# Patient Record
Sex: Female | Born: 1990 | Race: Black or African American | Hispanic: No | Marital: Single | Smoking: Never smoker
Health system: Southern US, Community
[De-identification: ages and names within clinical notes are randomized; demographics above are authoritative.]

## PROBLEM LIST (undated history)

## (undated) DIAGNOSIS — D649 Anemia, unspecified: Secondary | ICD-10-CM

## (undated) DIAGNOSIS — J189 Pneumonia, unspecified organism: Secondary | ICD-10-CM

## (undated) HISTORY — DX: Pneumonia, unspecified organism: J18.9

---

## 2015-07-02 ENCOUNTER — Encounter (HOSPITAL_BASED_OUTPATIENT_CLINIC_OR_DEPARTMENT_OTHER): Payer: Self-pay | Admitting: Emergency Medicine

## 2015-07-02 ENCOUNTER — Emergency Department (HOSPITAL_BASED_OUTPATIENT_CLINIC_OR_DEPARTMENT_OTHER)
Admission: EM | Admit: 2015-07-02 | Discharge: 2015-07-02 | Disposition: A | Discharge status: Home / Self Care | Payer: Self-pay | Attending: Emergency Medicine | Admitting: Emergency Medicine

## 2015-07-02 DIAGNOSIS — N938 Other specified abnormal uterine and vaginal bleeding: Secondary | ICD-10-CM | POA: Insufficient documentation

## 2015-07-02 DIAGNOSIS — Z862 Personal history of diseases of the blood and blood-forming organs and certain disorders involving the immune mechanism: Secondary | ICD-10-CM | POA: Insufficient documentation

## 2015-07-02 DIAGNOSIS — Z3202 Encounter for pregnancy test, result negative: Secondary | ICD-10-CM | POA: Insufficient documentation

## 2015-07-02 HISTORY — DX: Anemia, unspecified: D64.9

## 2015-07-02 LAB — CBC WITH DIFFERENTIAL/PLATELET
Basophils Absolute: 0 10*3/uL (ref 0.0–0.1)
Basophils Relative: 0 %
Eosinophils Absolute: 0.1 10*3/uL (ref 0.0–0.7)
Eosinophils Relative: 1 %
HEMATOCRIT: 37.5 % (ref 36.0–46.0)
HEMOGLOBIN: 12.2 g/dL (ref 12.0–15.0)
LYMPHS ABS: 4.2 10*3/uL — AB (ref 0.7–4.0)
LYMPHS PCT: 39 %
MCH: 26.2 pg (ref 26.0–34.0)
MCHC: 32.5 g/dL (ref 30.0–36.0)
MCV: 80.6 fL (ref 78.0–100.0)
Monocytes Absolute: 0.9 10*3/uL (ref 0.1–1.0)
Monocytes Relative: 8 %
NEUTROS PCT: 52 %
Neutro Abs: 5.6 10*3/uL (ref 1.7–7.7)
Platelets: 349 10*3/uL (ref 150–400)
RBC: 4.65 MIL/uL (ref 3.87–5.11)
RDW: 13.9 % (ref 11.5–15.5)
WBC: 10.8 10*3/uL — AB (ref 4.0–10.5)

## 2015-07-02 LAB — GC/CHLAMYDIA PROBE AMP (~~LOC~~) NOT AT ARMC
Chlamydia: NEGATIVE
Neisseria Gonorrhea: NEGATIVE

## 2015-07-02 LAB — PREGNANCY, URINE: Preg Test, Ur: NEGATIVE

## 2015-07-02 MED ORDER — MEDROXYPROGESTERONE ACETATE 10 MG PO TABS: 10.0000 mg | ORAL_TABLET | Freq: Every day | ORAL | Status: DC

## 2015-07-02 MED ORDER — ONDANSETRON 8 MG PO TBDP: 8.0000 mg | ORAL_TABLET | Freq: Three times a day (TID) | ORAL | Status: DC | PRN

## 2015-07-02 NOTE — ED Notes (Signed)
Extremely heavy bleeding with blood clots since yesterday. Pt states she has had her period for three months. Has used 36 tampons since yesterday morning.

## 2015-07-02 NOTE — ED Provider Notes (Signed)
CSN: [id number]     Arrival date & time 07/02/15  0133 History   First MD Initiated Contact with Patient 07/02/15 [phone]     Chief Complaint  Patient presents with  . Vaginal Bleeding     (Consider location/radiation/quality/duration/timing/severity/associated sxs/prior Treatment) HPI  This is a 24 year old female who normally has three-day periods. She is here with 3 months of persistent vaginal bleeding. The bleeding worsened over the past 2 days and she has been passing clots. She has used 36 tampons since yesterday morning. There is some associated sharp pain with this. She is having some lightheadedness as well.  Past Medical History  Diagnosis Date  . Anemia    History reviewed. No pertinent past surgical history. No family history on file. Social History  Substance Use Topics  . Smoking status: Never Smoker   . Smokeless tobacco: None  . Alcohol Use: No   OB History    Gravida Para Term Preterm AB TAB SAB Ectopic Multiple Living       Review of Systems  All other systems reviewed and are negative.   Allergies  Morphine and related  Home Medications   Prior to Admission medications   Not on File   BP 137/92 mmHg  Pulse 72  Resp 20  Ht  (1.651 m)  Wt 260 lb (117.935 kg)  BMI 43.27 kg/m2  SpO2 100%  LMP 07/02/2015   Physical Exam General: Well-developed, obese female in no acute distress; appearance consistent with age of record HENT: normocephalic; atraumatic Eyes: pupils equal, round and reactive to light; extraocular muscles intact; no conjunctival pallor Neck: supple Heart: regular rate and rhythm Lungs: clear to auscultation bilaterally Abdomen: soft; nondistended; suprapubic tenderness; bowel sounds present GU: Normal external genitalia; vaginal bleeding; no cervical motion tenderness Extremities: No deformity; full range of motion; pulses normal Neurologic: Awake, alert and oriented; motor function intact in all  extremities and symmetric; no facial droop Skin: Warm and dry Psychiatric: Normal mood and affect    ED Course  Procedures (including critical care time)   MDM  Nursing notes and vitals signs, including pulse oximetry, reviewed.  Summary of this visit's results, reviewed by myself:  Labs:  Results for orders placed or performed during the hospital encounter of 07/02/15 (from the past 24 hour(s))  CBC with Differential/Platelet     Status: Abnormal   Collection Time: 07/02/15  2:05 AM  Result Value Ref Range   WBC 10.8 (H) 4.0 - 10.5 K/uL   RBC 4.65 3.87 - 5.11 MIL/uL   Hemoglobin 12.2 12.0 - 15.0 g/dL   HCT 11.9 14.7 - 82.9 %   MCV 80.6 78.0 - 100.0 fL   MCH 26.2 26.0 - 34.0 pg   MCHC 32.5 30.0 - 36.0 g/dL   RDW 56.2 13.0 - 86.5 %   Platelets 349 150 - 400 K/uL   Neutrophils Relative % 52 %   Neutro Abs 5.6 1.7 - 7.7 K/uL   Lymphocytes Relative 39 %   Lymphs Abs 4.2 (H) 0.7 - 4.0 K/uL   Monocytes Relative 8 %   Monocytes Absolute 0.9 0.1 - 1.0 K/uL   Eosinophils Relative 1 %   Eosinophils Absolute 0.1 0.0 - 0.7 K/uL   Basophils Relative 0 %   Basophils Absolute 0.0 0.0 - 0.1 K/uL  Pregnancy, urine     Status: None   Collection Time: 07/02/15  2:29 AM  Result Value Ref Range  Preg Test, Ur NEGATIVE NEGATIVE       Paula LibraJohn Jaeleah Smyser, MD 07/02/15 [phone]

## 2015-07-02 NOTE — Discharge Instructions (Signed)

## 2015-07-17 ENCOUNTER — Encounter: Payer: Self-pay | Admitting: Family Medicine

## 2015-07-17 DIAGNOSIS — N938 Other specified abnormal uterine and vaginal bleeding: Secondary | ICD-10-CM

## 2015-07-23 ENCOUNTER — Telehealth: Payer: Self-pay | Admitting: *Deleted

## 2015-07-23 MED ORDER — MEDROXYPROGESTERONE ACETATE 10 MG PO TABS: 20.0000 mg | ORAL_TABLET | Freq: Every day | ORAL | Status: DC

## 2015-07-23 NOTE — Telephone Encounter (Addendum)
Message received from pharmacist stating that pt reported having spoken to our staff and Rx was supposed to be faxed however they have not received. [place] is currently in [place]. I called pt and she stated that she contacted us via MyChart and was told that her medication dose would be increased and rx would be sent to her pharmacy. Per chart review, pt had correspondence by Lyla Sonarrie, RN.   Rx e-prescribed today by Marylynn Pearsonarrie Hillman, RN

## 2015-07-25 ENCOUNTER — Encounter: Payer: Self-pay | Admitting: Family Medicine

## 2015-07-25 ENCOUNTER — Ambulatory Visit (INDEPENDENT_AMBULATORY_CARE_PROVIDER_SITE_OTHER): Payer: Self-pay | Admitting: Family Medicine

## 2015-07-25 VITALS — BP 118/83 | HR 79 | Temp 98.4°F | Resp 18 | Ht 65.0 in | Wt 280.8 lb

## 2015-07-25 DIAGNOSIS — N939 Abnormal uterine and vaginal bleeding, unspecified: Secondary | ICD-10-CM

## 2015-07-25 MED ORDER — NORGESTIMATE-ETH ESTRADIOL 0.25-35 MG-MCG PO TABS: 1.0000 | ORAL_TABLET | Freq: Every day | ORAL | Status: DC

## 2015-07-25 NOTE — Progress Notes (Signed)
   Subjective:    Patient ID: Brianna Wilson, female    DOB: 06/23/1991, 24 y.o.   MRN: [id number]  HPI Seen for vaginal bleeding.  Started in September and continual heavy bleeding since that time.  Was seen in ED in early December and given provera for 10 days.  Bleeding returned again 2-3 days after stopping the medication.  Started provera again on 12/22 and stopped.  Prior to this, menses every 30 days with bleeding for 3 days.  Started menses at age 24.   Review of Systems  Constitutional: Negative for fever, chills and fatigue.  Respiratory: Negative for shortness of breath.   Gastrointestinal: Negative for nausea, vomiting, abdominal pain and diarrhea.  Genitourinary: Negative for hematuria, vaginal discharge, vaginal pain and dyspareunia.  Neurological: Negative for dizziness, light-headedness and headaches.  All other systems reviewed and are negative.  I have reviewed the patients past medical, family, and social history.  I have reviewed the patient's medication list and allergies.     Objective:   Physical Exam  Constitutional: She is oriented to person, place, and time. She appears well-developed and well-nourished.  HENT:  Head: Normocephalic and atraumatic.  Right Ear: External ear normal.  Left Ear: External ear normal.  Eyes: Conjunctivae and EOM are normal. Pupils are equal, round, and reactive to light. No scleral icterus.  Neck: Normal range of motion. Neck supple.  Cardiovascular: Normal rate, regular rhythm and normal heart sounds.  Exam reveals no gallop and no friction rub.   No murmur heard. Pulmonary/Chest: Effort normal and breath sounds normal. No respiratory distress. She has no wheezes. She has no rales. She exhibits no tenderness.  Abdominal: Soft. Bowel sounds are normal. She exhibits no distension and no mass. There is no tenderness. There is no rebound and no guarding.  Musculoskeletal: She exhibits no edema or tenderness.  Neurological: She is alert  and oriented to person, place, and time.  Skin: Skin is warm and dry. No rash noted. No erythema. No pallor.  Psychiatric: She has a normal mood and affect. Her behavior is normal. Judgment and thought content normal.      Assessment & Plan:  1. Abnormal uterine bleeding Will stop provera and switch to sprintec, as this will be more manageable.  Will follow up in 4-6 months.  Discussed that will likely have withdrawal bleed when transitions from one birth control. Advised patient to call if she continues to have uncontrolled bleeding despite birth control pills.

## 2015-07-29 ENCOUNTER — Other Ambulatory Visit: Payer: Self-pay

## 2015-07-29 MED ORDER — IBUPROFEN 600 MG PO TABS: 600.0000 mg | ORAL_TABLET | Freq: Four times a day (QID) | ORAL | Status: DC | PRN

## 2015-08-26 ENCOUNTER — Other Ambulatory Visit: Payer: Self-pay | Admitting: Family Medicine

## 2015-08-26 ENCOUNTER — Telehealth: Payer: Self-pay | Admitting: *Deleted

## 2015-08-26 NOTE — Telephone Encounter (Signed)
Pt left message stating that she saw Dr. Adrian Blackwater last month and started on OCP for bleeding. She was told to call back if she was having problems. She reports that after starting on the OCP. The first 2 weeks were fine then she started to bleed and has not stopped. She wants to stop the OCP's and have Dr. Adrian Blackwater to put her back on Provera as this stopped her bleeding previously. I returned pt's call and discussed her concerns. She confirms that she has not skipped or missed any pills. She had no bleeding for the first 2 weeks of her initial pill pack. She states that she has been bleeding for 2 weeks now and at times is very heavy, bright red and with clots. She is due to begin the second pack of pills in 2 days and is not sure what to do. I advised pt that I will send a message to Dr. Adrian Blackwater and call her back with his response.

## 2015-08-28 ENCOUNTER — Encounter: Payer: Self-pay | Admitting: Family Medicine

## 2015-08-28 ENCOUNTER — Telehealth: Payer: Self-pay | Admitting: General Practice

## 2015-08-28 NOTE — Telephone Encounter (Signed)
I can recommend one of two options: she could continue taking the OCPs, starting the second pack.  Occasionally, it takes a couple of cycles before the bleeding become controlled.  The second option would be to restart the provera, then switch to depo provera when bleeding is controlled.

## 2015-08-28 NOTE — Telephone Encounter (Signed)
Patient called and left message stating she is wanting to talk to a nurse. Patient states she is waiting for a return call in reference to her medication.

## 2015-08-28 NOTE — Telephone Encounter (Signed)
Returned patient's call. She was upset because she was expecting a call yesterday. She stated she has been bleeding since the second week of her first pack of birth control pills. She never started the second pack. I told her the options per Dr Adrian Blackwater put forth in his response to the prior encounter note. She was not agreeable to any of those options. She stated that the problem is that she wants to have babies and all the options will not let her do that. I suggested that she make an appointment in the clinic to talk to a provider. Patient does not have insurance and can't afford to pay. Will have front office send her a charity application and schedule her to be seen. She requests only Dr Adrian Blackwater.

## 2015-09-26 ENCOUNTER — Ambulatory Visit: Payer: Self-pay | Admitting: Family Medicine

## 2015-11-01 ENCOUNTER — Encounter: Payer: Self-pay | Admitting: Family Medicine

## 2015-11-01 ENCOUNTER — Ambulatory Visit (INDEPENDENT_AMBULATORY_CARE_PROVIDER_SITE_OTHER): Payer: Medicaid Other | Admitting: Family Medicine

## 2015-11-01 VITALS — BP 130/74 | HR 79 | Temp 98.6°F | Ht 65.0 in | Wt 283.5 lb

## 2015-11-01 DIAGNOSIS — N939 Abnormal uterine and vaginal bleeding, unspecified: Secondary | ICD-10-CM

## 2015-11-01 DIAGNOSIS — Z3041 Encounter for surveillance of contraceptive pills: Secondary | ICD-10-CM

## 2015-11-01 LAB — POCT PREGNANCY, URINE: Preg Test, Ur: NEGATIVE

## 2015-11-01 MED ORDER — NORGESTIMATE-ETH ESTRADIOL 0.25-35 MG-MCG PO TABS: 1.0000 | ORAL_TABLET | Freq: Every day | ORAL | Status: DC

## 2015-11-01 NOTE — Progress Notes (Signed)
States  Here because you have not had a period since she stopped the birth control pills. Had pills for just a month or so because of heavy bleeding. Before all that had regular periods.

## 2015-11-01 NOTE — Progress Notes (Signed)
   Subjective:    Patient ID: Brianna Wilson, female    DOB: Apr 28, 1991, 25 y.o.   MRN: [id number]  HPI Patient seen for follow up of AUB.  Was placed on sprintec in December, which was working well.  Had confusion at lab and was not able to pick up refill.  Last pills in February, has not had bleeding since.     Review of Systems  Constitutional: Negative for fever, chills and fatigue.  Genitourinary: Negative for vaginal bleeding, vaginal discharge and vaginal pain.       Objective:   Physical Exam  Constitutional: She appears well-developed and well-nourished.  Cardiovascular: Normal rate, regular rhythm and normal heart sounds.  Exam reveals no gallop and no friction rub.   No murmur heard. Pulmonary/Chest: Effort normal and breath sounds normal.       Assessment & Plan:  1. Abnormal uterine bleeding Continue sprintec.  Advised to continue until she wants to become pregnant.  Also discussed that may take 5-6 months before cycle returns after stopping pills.  F/u 1 year for annual.

## 2015-12-06 ENCOUNTER — Encounter: Payer: Self-pay | Admitting: Family Medicine

## 2015-12-09 ENCOUNTER — Other Ambulatory Visit: Payer: Self-pay | Admitting: Family Medicine

## 2015-12-09 MED ORDER — MEDROXYPROGESTERONE ACETATE 10 MG PO TABS: 10.0000 mg | ORAL_TABLET | Freq: Every day | ORAL | Status: DC

## 2015-12-09 NOTE — Telephone Encounter (Signed)
Called patient.  Would like to be on provera.  Will start on provera 10mg  PO, then change to depo provera.  Will schedule for PAP per patient's request.

## 2016-04-06 ENCOUNTER — Other Ambulatory Visit: Payer: Self-pay | Admitting: Family Medicine

## 2016-05-17 ENCOUNTER — Other Ambulatory Visit: Payer: Self-pay | Admitting: Family Medicine

## 2016-05-20 ENCOUNTER — Ambulatory Visit: Payer: Medicaid Other | Admitting: Family Medicine

## 2016-05-29 ENCOUNTER — Encounter: Payer: Self-pay | Admitting: Family Medicine

## 2016-05-29 ENCOUNTER — Other Ambulatory Visit: Payer: Self-pay | Admitting: Family Medicine

## 2016-05-29 DIAGNOSIS — N939 Abnormal uterine and vaginal bleeding, unspecified: Secondary | ICD-10-CM

## 2016-06-02 MED ORDER — MEDROXYPROGESTERONE ACETATE 10 MG PO TABS: 10.0000 mg | ORAL_TABLET | Freq: Every day | ORAL | 1 refills | Status: DC

## 2016-07-31 ENCOUNTER — Encounter: Payer: Self-pay | Admitting: Family Medicine

## 2016-07-31 ENCOUNTER — Ambulatory Visit (INDEPENDENT_AMBULATORY_CARE_PROVIDER_SITE_OTHER): Payer: BLUE CROSS/BLUE SHIELD | Admitting: Family Medicine

## 2016-07-31 VITALS — BP 126/74 | HR 68 | Wt 285.2 lb

## 2016-07-31 DIAGNOSIS — Z0000 Encounter for general adult medical examination without abnormal findings: Secondary | ICD-10-CM

## 2016-07-31 DIAGNOSIS — Z3042 Encounter for surveillance of injectable contraceptive: Secondary | ICD-10-CM | POA: Diagnosis not present

## 2016-07-31 DIAGNOSIS — Z01419 Encounter for gynecological examination (general) (routine) without abnormal findings: Secondary | ICD-10-CM | POA: Diagnosis not present

## 2016-07-31 MED ORDER — MEDROXYPROGESTERONE ACETATE 150 MG/ML IM SUSP
150.0000 mg | Freq: Once | INTRAMUSCULAR | Status: AC
  Administered 2016-07-31: 150 mg via INTRAMUSCULAR

## 2016-07-31 NOTE — Progress Notes (Signed)
Subjective:     Brianna Wilson is a 26 y.o. female and is here for a comprehensive physical exam. The patient reports no problems.  Heterosexual relationship, declines STD testing. Light periods every month. Last PAP - normal    Social History   Social History  . Marital status: Single    Spouse name: N/A  . Number of children: N/A  . Years of education: N/A   Occupational History  . Not on file.   Social History Main Topics  . Smoking status: Never Smoker  . Smokeless tobacco: Never Used  . Alcohol use No  . Drug use: No  . Sexual activity: Yes    Birth control/ protection: None   Other Topics Concern  . Not on file   Social History Narrative  . No narrative on file   Health Maintenance  Topic Date Due  . HIV Screening  04/15/2004  . TETANUS/TDAP  04/15/2008  . PAP SMEAR  04/15/2010  . INFLUENZA VACCINE  02/25/2016    The following portions of the patient's history were reviewed and updated as appropriate: allergies, current medications, past family history, past medical history, past social history, past surgical history and problem list.  Review of Systems Pertinent items are noted in HPI.   Objective:   General Appearance:    Alert, cooperative, no distress, appears stated age  Head:    Normocephalic, without obvious abnormality, atraumatic  Eyes:    PERRL, conjunctiva/corneas clear, EOM's intact, fundi    benign, both eyes  Neck:   Supple, symmetrical, trachea midline, no adenopathy;    thyroid:  no enlargement/tenderness/nodules; no carotid   bruit or JVD  Back:     Symmetric, no curvature, ROM normal, no CVA tenderness  Lungs:     Clear to auscultation bilaterally, respirations unlabored  Chest Wall:    No tenderness or deformity   Heart:    Regular rate and rhythm, S1 and S2 normal, no murmur, rub   or gallop  Breast Exam:    No tenderness, masses, or nipple abnormality  Abdomen:     Soft, non-tender, bowel sounds active all four quadrants,    no  masses, no organomegaly  Genitalia:    Normal female without lesion, discharge or tenderness.         Vaginal rugae normal.  Cervix normal in appearance.  Uterus   normal in size.  Adnexa normal, no masses or fullness   palpated.  Extremities:   Extremities normal, atraumatic, no cyanosis or edema  Pulses:   2+ and symmetric all extremities  Skin:   Skin color, texture, turgor normal, no rashes or lesions  Lymph nodes:   Cervical, supraclavicular, and axillary nodes normal  Neurologic:   CNII-XII intact, normal strength, sensation and reflexes    throughout      Assessment:    Healthy female exam.      Plan:    Depo provera PAP done. Discussed healthy diet and exercise. See After Visit Summary for Counseling Recommendations

## 2016-07-31 NOTE — Addendum Note (Signed)
Addended by: Faythe CasaBELLAMY, Kaydince Towles M on: 07/31/2016 11:52 AM   Modules accepted: Orders

## 2016-08-03 LAB — CYTOLOGY - PAP: Diagnosis: NEGATIVE

## 2016-10-29 ENCOUNTER — Ambulatory Visit (INDEPENDENT_AMBULATORY_CARE_PROVIDER_SITE_OTHER): Payer: BLUE CROSS/BLUE SHIELD | Admitting: Family Medicine

## 2016-10-29 VITALS — BP 121/77 | HR 81 | Ht 65.0 in | Wt 286.3 lb

## 2016-10-29 DIAGNOSIS — Z3042 Encounter for surveillance of injectable contraceptive: Secondary | ICD-10-CM

## 2016-10-29 MED ORDER — MEDROXYPROGESTERONE ACETATE 150 MG/ML IM SUSP
150.0000 mg | Freq: Once | INTRAMUSCULAR | Status: AC
  Administered 2016-10-29: 150 mg via INTRAMUSCULAR

## 2016-10-29 NOTE — Progress Notes (Signed)
   Subjective:    Patient ID: Brianna Wilson, female    DOB: [birth year], 26 y.o.   MRN: [id number]  HPI Seen for follow up of contraception. Concerned about weight gain, but she has only gained 1 pound from last visit 3 months ago. No problems with injection. Had 3 days of bleeding in March, but nothing else.   Review of Systems     Objective:   Physical Exam  Constitutional: She appears well-developed and well-nourished.  Cardiovascular: Normal rate and regular rhythm.   Pulmonary/Chest: Effort normal and breath sounds normal.  Abdominal: Soft. There is no tenderness.  Skin: Skin is warm and dry. No rash noted. No erythema. No pallor.  Psychiatric: She has a normal mood and affect. Her behavior is normal. Judgment and thought content normal.      Assessment & Plan:  1. Encounter for surveillance of injectable contraceptive Continue injections

## 2016-12-14 ENCOUNTER — Other Ambulatory Visit: Payer: Self-pay | Admitting: Family Medicine

## 2016-12-14 ENCOUNTER — Other Ambulatory Visit: Payer: Self-pay | Admitting: General Practice

## 2016-12-14 DIAGNOSIS — N946 Dysmenorrhea, unspecified: Secondary | ICD-10-CM

## 2016-12-14 MED ORDER — IBUPROFEN 600 MG PO TABS: 600.0000 mg | ORAL_TABLET | Freq: Three times a day (TID) | ORAL | 1 refills | Status: DC | PRN

## 2016-12-27 ENCOUNTER — Encounter: Payer: Self-pay | Admitting: Family Medicine

## 2016-12-28 ENCOUNTER — Encounter: Payer: Self-pay | Admitting: Family Medicine

## 2016-12-31 ENCOUNTER — Ambulatory Visit: Payer: BLUE CROSS/BLUE SHIELD | Admitting: Family Medicine

## 2017-01-04 ENCOUNTER — Encounter: Payer: Self-pay | Admitting: Family Medicine

## 2017-01-04 ENCOUNTER — Ambulatory Visit (INDEPENDENT_AMBULATORY_CARE_PROVIDER_SITE_OTHER): Payer: BLUE CROSS/BLUE SHIELD | Admitting: Family Medicine

## 2017-01-04 DIAGNOSIS — N920 Excessive and frequent menstruation with regular cycle: Secondary | ICD-10-CM

## 2017-01-04 NOTE — Patient Instructions (Signed)

## 2017-01-04 NOTE — Assessment & Plan Note (Signed)
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

## 2017-01-04 NOTE — Progress Notes (Signed)
   Subjective:    Patient ID: Brianna Wilson is a 26 y.o. female presenting with Pelvic Pain and Menstrual Problem (bleeding since April )  on 01/04/2017  HPI: Seen with abnormal bleeding and placed on provera, sprintec then Depo. Has had 2 depo shots and now having some bleeding with spotting from 3 days after a shot until just now.. Having pain. Does not want to be on contraception.  Review of Systems  Constitutional: Negative for chills and fever.  Respiratory: Negative for shortness of breath.   Cardiovascular: Negative for chest pain.  Gastrointestinal: Negative for abdominal pain, nausea and vomiting.  Genitourinary: Negative for dysuria.  Skin: Negative for rash.      Objective:    BP 118/71   Pulse 76   Ht 5\' 5"  (1.651 m)   Wt 283 lb (128.4 kg)   LMP 10/31/2016 (Exact Date)   BMI 47.09 kg/m  Physical Exam  Constitutional: She is oriented to person, place, and time. She appears well-developed and well-nourished. No distress.  HENT:  Head: Normocephalic and atraumatic.  Eyes: No scleral icterus.  Neck: Neck supple.  Cardiovascular: Normal rate.   Pulmonary/Chest: Effort normal.  Abdominal: Soft.  Neurological: She is alert and oriented to person, place, and time.  Skin: Skin is warm and dry.  Psychiatric: She has a normal mood and affect.        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      Relevant Orders   TSH   US Transvaginal Non-OB      Total face-to-face time with patient: 15 minutes. Over 50% of encounter was spent on counseling and coordination of care. Return in about 3 months (around 04/06/2017).  Reva Boresanya S Diezel Mazur 01/04/2017 11:23  AM

## 2017-01-05 LAB — TSH: TSH: 1.24 u[IU]/mL (ref 0.450–4.500)

## 2017-01-08 ENCOUNTER — Ambulatory Visit (HOSPITAL_COMMUNITY)
Admission: RE | Admit: 2017-01-08 | Discharge: 2017-01-08 | Disposition: A | Discharge status: Home / Self Care | Payer: BLUE CROSS/BLUE SHIELD | Source: Ambulatory Visit | Attending: Family Medicine | Admitting: Family Medicine

## 2017-01-08 DIAGNOSIS — N920 Excessive and frequent menstruation with regular cycle: Secondary | ICD-10-CM | POA: Diagnosis present

## 2018-07-05 ENCOUNTER — Encounter: Payer: Self-pay | Admitting: *Deleted

## 2018-07-05 ENCOUNTER — Other Ambulatory Visit: Payer: Self-pay | Admitting: *Deleted

## 2018-07-05 DIAGNOSIS — N939 Abnormal uterine and vaginal bleeding, unspecified: Secondary | ICD-10-CM

## 2018-07-05 MED ORDER — MEDROXYPROGESTERONE ACETATE 10 MG PO TABS
10.0000 mg | ORAL_TABLET | Freq: Every day | ORAL | 0 refills | Status: DC
Start: 2018-07-05 — End: 2018-08-08

## 2018-07-05 NOTE — Progress Notes (Signed)
Opened in error

## 2018-07-05 NOTE — Progress Notes (Signed)
Patient requested one refill of provera to last until her appointment with us. Approved by Dr. Shawnie PonsPratt.

## 2018-07-25 ENCOUNTER — Ambulatory Visit: Payer: BLUE CROSS/BLUE SHIELD | Admitting: Family Medicine

## 2018-08-08 ENCOUNTER — Ambulatory Visit (INDEPENDENT_AMBULATORY_CARE_PROVIDER_SITE_OTHER): Payer: Self-pay | Admitting: Family Medicine

## 2018-08-08 ENCOUNTER — Encounter: Payer: Self-pay | Admitting: Family Medicine

## 2018-08-08 VITALS — BP 132/82 | HR 89 | Wt 301.9 lb

## 2018-08-08 DIAGNOSIS — N939 Abnormal uterine and vaginal bleeding, unspecified: Secondary | ICD-10-CM

## 2018-08-08 NOTE — Progress Notes (Signed)
   Subjective:    Patient ID: Brianna Wilson, female    DOB: Dec 03, 1990, 28 y.o.   MRN: [id number]  HPI Patient seen for AUB x30 days. This occurred after taking a weight loss cleanse. Had lost about 7 pounds in a couple of days. Has continued to diet. Trying to lose weight. Was started on provera 10mg  daily. Bleeding slowed, but didn't stop until 3 days ago. Is now out of 30 day supply of provera.   Review of Systems     Objective:   Physical Exam Constitutional:      Appearance: She is obese.  HENT:     Head: Normocephalic and atraumatic.  Cardiovascular:     Rate and Rhythm: Normal rate and regular rhythm.     Pulses: Normal pulses.     Heart sounds: Normal heart sounds.  Pulmonary:     Effort: Pulmonary effort is normal.     Breath sounds: Normal breath sounds.  Abdominal:     General: Abdomen is flat.     Palpations: Abdomen is soft.  Skin:    Capillary Refill: Capillary refill takes less than 2 seconds.  Neurological:     General: No focal deficit present.     Mental Status: She is alert.       Assessment & Plan:  1. Abnormal uterine bleeding (AUB) Likely due to weight loss/dieting. Will watch for now. Discussed contraception: IUD vs nexplanon. IUD would be preferrable - more weight neutral and better bleeding side effect profile.

## 2018-08-11 ENCOUNTER — Other Ambulatory Visit: Payer: Self-pay | Admitting: Family Medicine

## 2018-08-11 MED ORDER — MEDROXYPROGESTERONE ACETATE 10 MG PO TABS: 20.0000 mg | ORAL_TABLET | Freq: Every day | ORAL | 3 refills | Status: DC

## 2019-01-25 IMAGING — US US TRANSVAGINAL NON-OB
1 series · 15 of 25 positions shown · non-contrast
Comparison: None.

CLINICAL DATA: Menorrhagia with regular cycle. Gravida 1 para 0
AB1. Constant bleeding.

EXAM:
ULTRASOUND PELVIS TRANSVAGINAL
TECHNIQUE: Transvaginal ultrasound examination of the pelvis was performed
including evaluation of the uterus, ovaries, adnexal regions, and
pelvic cul-de-sac.

[Series 1: us transvaginal non-ob · 15 of 35 slices shown]
[im 1/35]
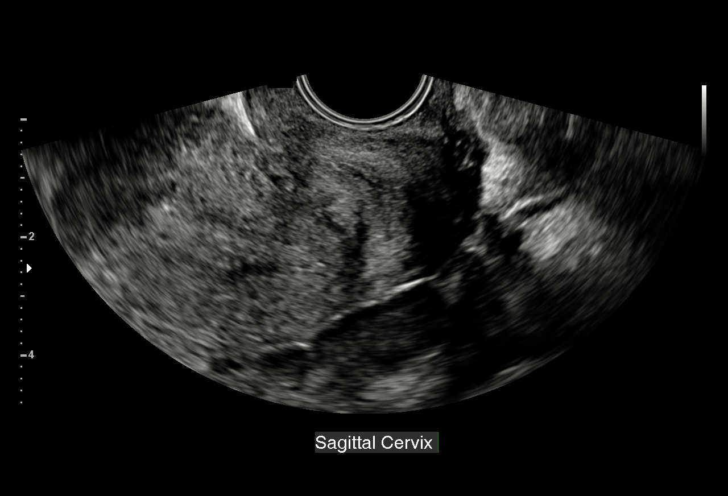
[im 3/35]
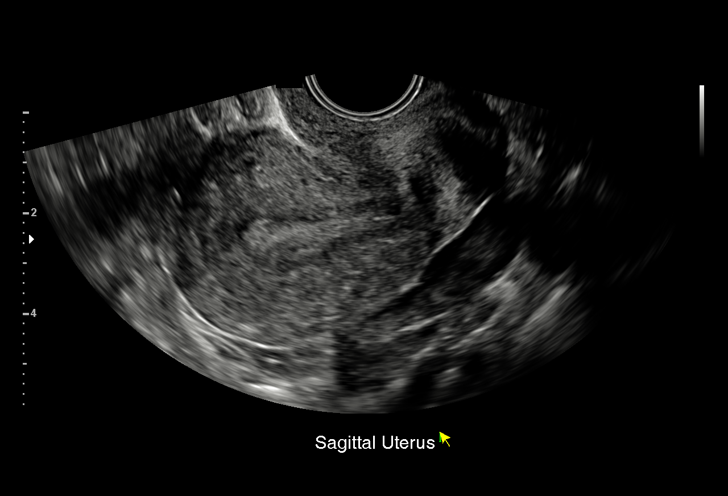
[im 6/35]
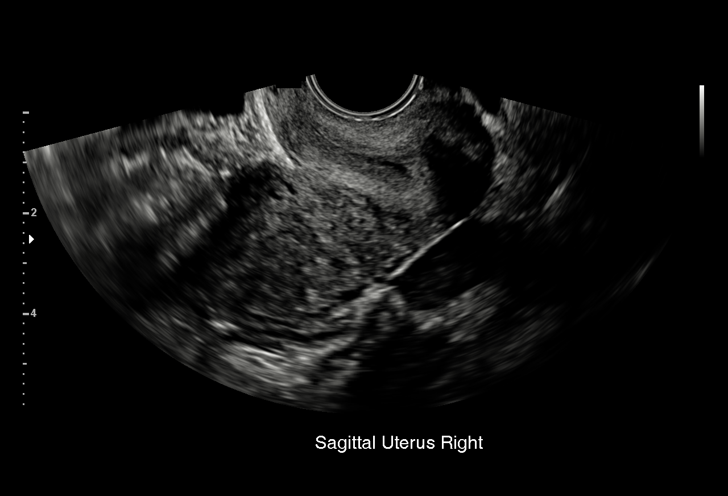
[im 8/35]
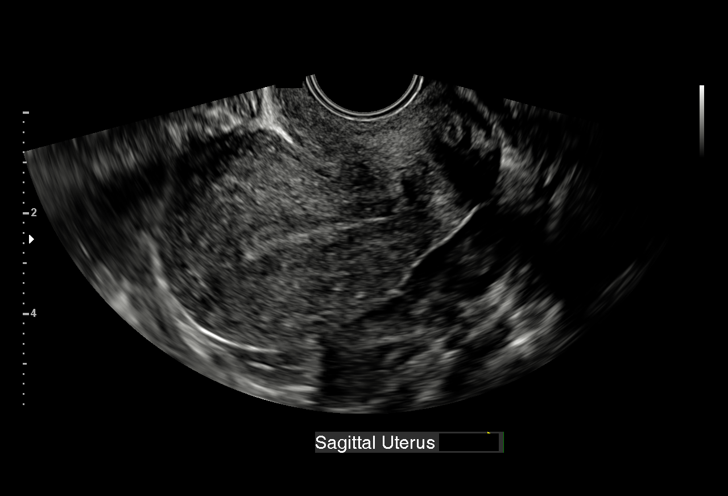
[im 10/35]
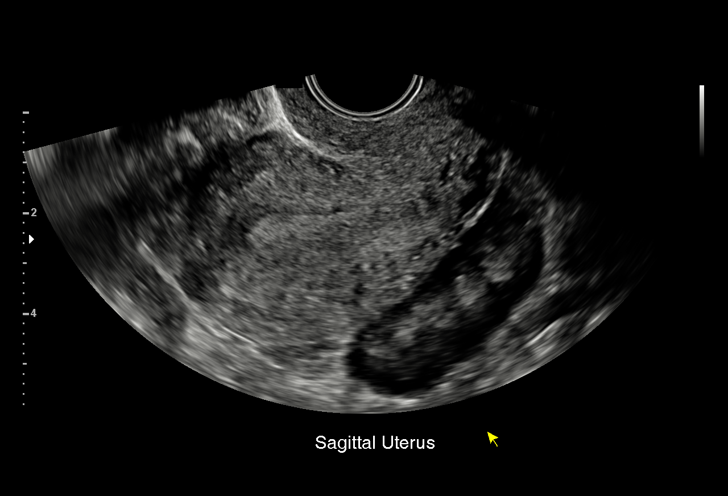
[im 13/35]
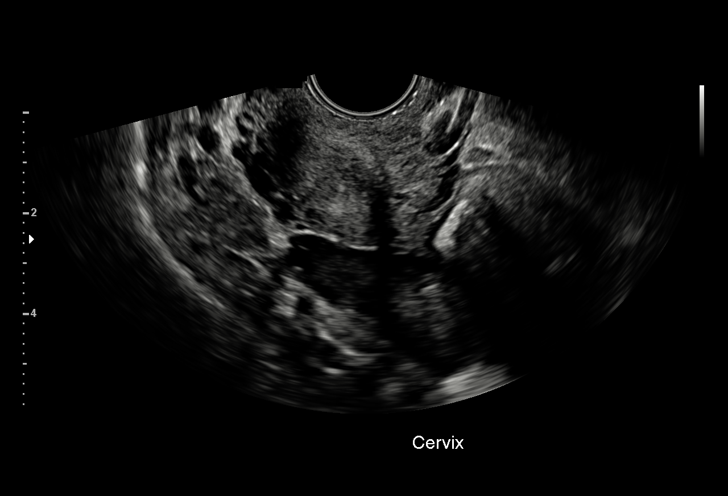
[im 15/35]
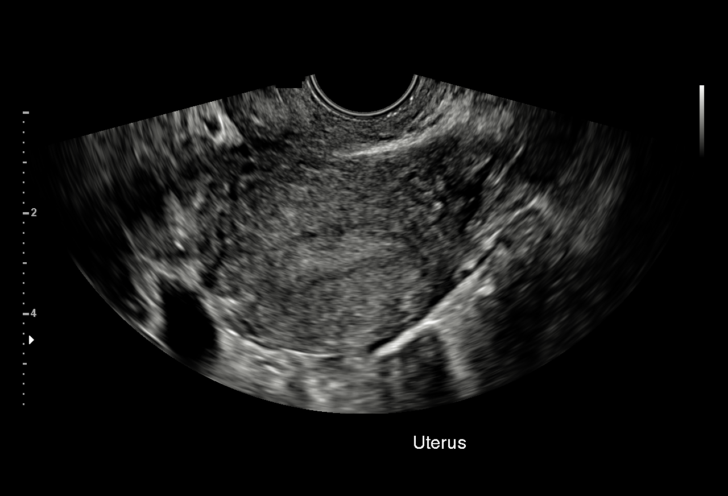
[im 18/35]
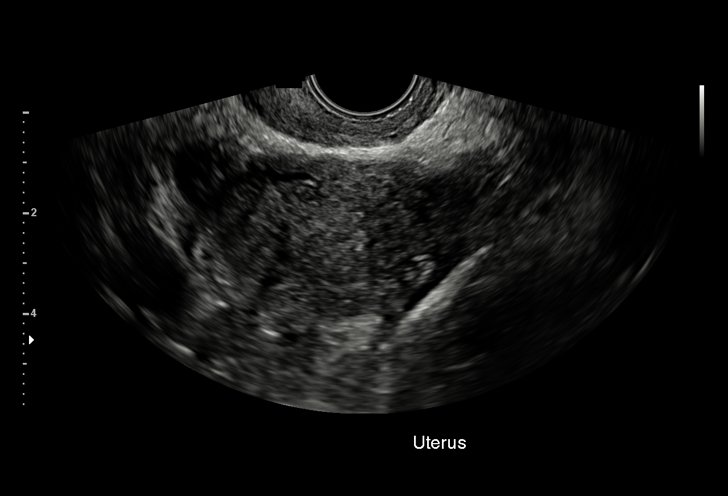
[im 20/35]
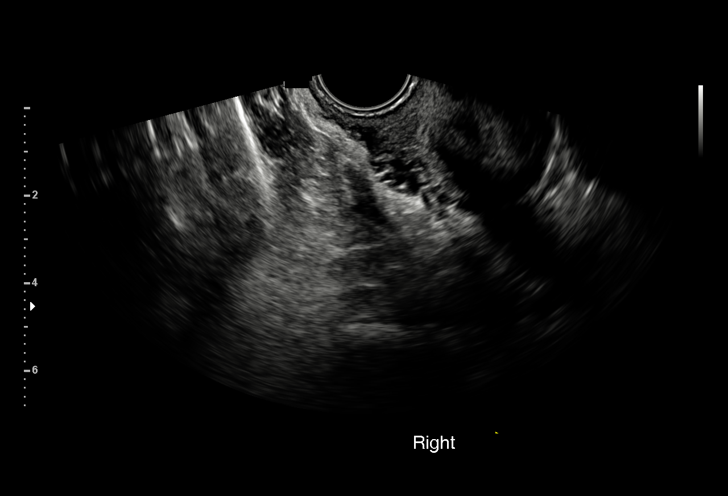
[im 22/35]
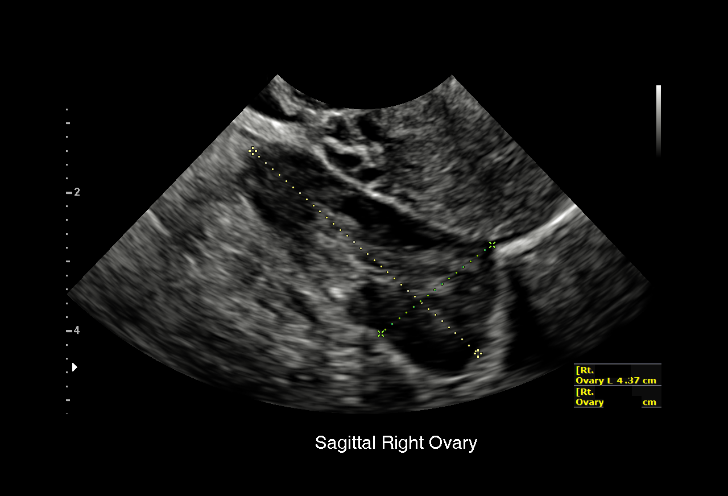
[im 25/35]
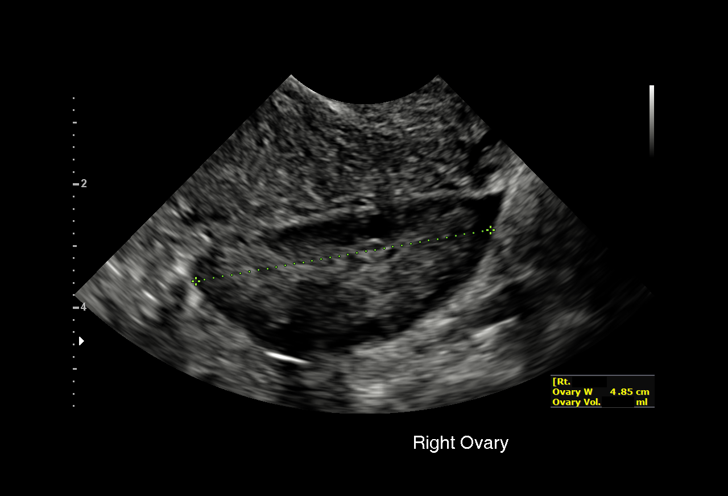
[im 27/35]
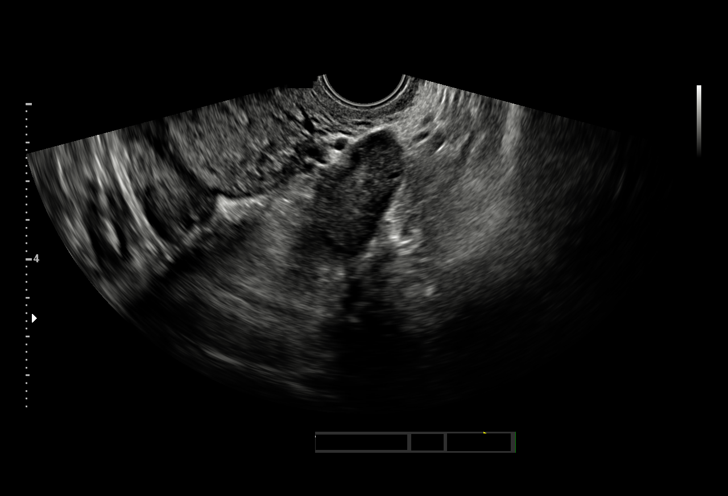
[im 29/35]
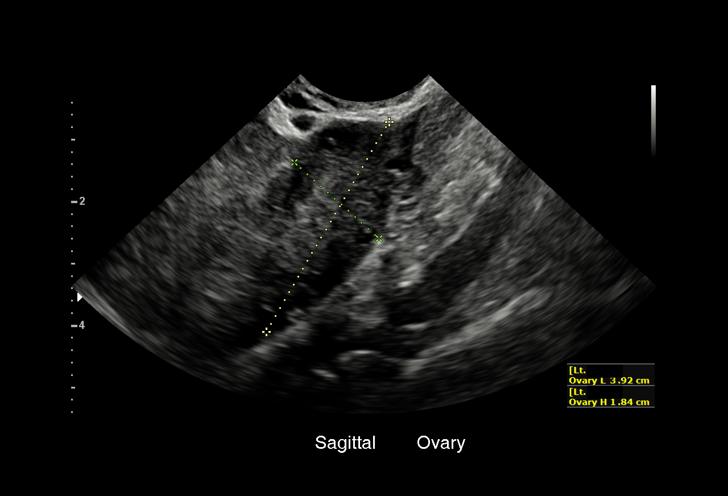
[im 32/35]
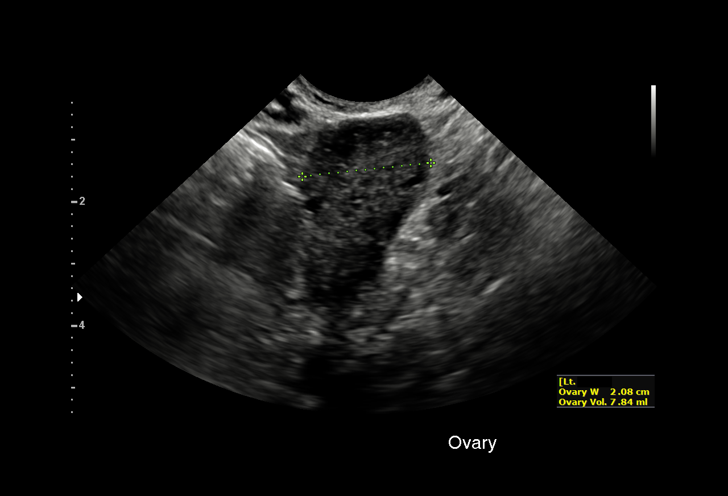
[im 35/35]
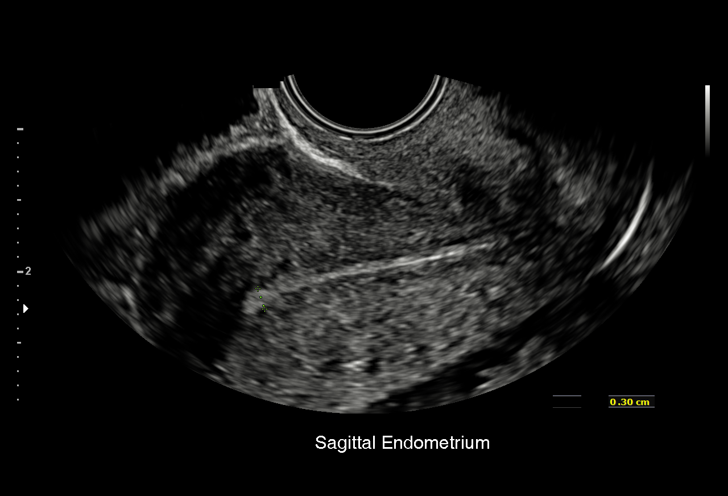

[15 of 25 positions shown; findings below may reference images not displayed]

FINDINGS: Uterus

Measurements: 7.2 x 4.3 x 5.5 cm. No fibroids or other mass
visualized.

Endometrium

Thickness: 3.0 mm.  No focal abnormality visualized.

Right ovary

Measurements: 4.4 x 2.1 x 4.9 cm. Normal appearance/no adnexal mass.

Left ovary

Measurements: 3.9 x 1.8 x 2.1 cm. Normal appearance/no adnexal mass.

Other findings: Small amount of free pelvic fluid is likely
physiologic.
IMPRESSION: Normal pelvic ultrasound.

## 2019-06-13 MED ORDER — MEDROXYPROGESTERONE ACETATE 10 MG PO TABS: 20.0000 mg | ORAL_TABLET | Freq: Every day | ORAL | 3 refills | Status: AC

## 2020-01-24 ENCOUNTER — Other Ambulatory Visit (HOSPITAL_COMMUNITY)
Admission: RE | Admit: 2020-01-24 | Discharge: 2020-01-24 | Disposition: A | Discharge status: Home / Self Care | Payer: BC Managed Care – PPO | Source: Ambulatory Visit | Attending: Family Medicine | Admitting: Family Medicine

## 2020-01-24 ENCOUNTER — Ambulatory Visit (INDEPENDENT_AMBULATORY_CARE_PROVIDER_SITE_OTHER): Payer: BC Managed Care – PPO | Admitting: Family Medicine

## 2020-01-24 ENCOUNTER — Encounter: Payer: Self-pay | Admitting: Family Medicine

## 2020-01-24 ENCOUNTER — Other Ambulatory Visit: Payer: Self-pay

## 2020-01-24 VITALS — BP 135/91 | HR 85 | Ht 65.0 in | Wt 307.0 lb

## 2020-01-24 DIAGNOSIS — Z30430 Encounter for insertion of intrauterine contraceptive device: Secondary | ICD-10-CM

## 2020-01-24 DIAGNOSIS — N939 Abnormal uterine and vaginal bleeding, unspecified: Secondary | ICD-10-CM | POA: Insufficient documentation

## 2020-01-24 DIAGNOSIS — Z124 Encounter for screening for malignant neoplasm of cervix: Secondary | ICD-10-CM

## 2020-01-24 MED ORDER — LEVONORGESTREL 19.5 MCG/DAY IU IUD: INTRAUTERINE_SYSTEM | Freq: Once | INTRAUTERINE | Status: AC

## 2020-01-24 NOTE — Progress Notes (Addendum)
Having AUB. Fairly consistent if not taking provera. Will place IUD. Also due for PAP.  IUD Procedure Note Patient identified, informed consent performed, signed copy in chart, time out was performed.  Urine pregnancy test negative.  Speculum placed in the vagina.  Cervix visualized.  Cleaned with Betadine x 2.  Cervical block placed with lidocaine with epi, 7mL. Grasped anteriorly with a single tooth tenaculum.  Uterus sounded to 9 cm.  Liletta  IUD placed per manufacturer's recommendations.  Strings trimmed to 3 cm. Tenaculum was removed, good hemostasis noted.  Patient tolerated procedure well.   Patient given post procedure instructions and Liletta care card with expiration date.  Patient is asked to check IUD strings periodically and follow up in 4-6 weeks for IUD check.

## 2020-01-24 NOTE — Progress Notes (Signed)
cyt

## 2020-01-25 LAB — CYTOLOGY - PAP
Chlamydia: NEGATIVE
Comment: NEGATIVE
Comment: NEGATIVE
Comment: NORMAL
Diagnosis: NEGATIVE
High risk HPV: POSITIVE — AB
Neisseria Gonorrhea: NEGATIVE

## 2020-01-25 LAB — POCT PREGNANCY, URINE: Preg Test, Ur: NEGATIVE

## 2020-01-26 ENCOUNTER — Encounter: Payer: Self-pay | Admitting: Family Medicine

## 2020-01-26 DIAGNOSIS — B977 Papillomavirus as the cause of diseases classified elsewhere: Secondary | ICD-10-CM | POA: Insufficient documentation

## 2020-03-01 ENCOUNTER — Other Ambulatory Visit: Payer: Self-pay | Admitting: Family Medicine

## 2020-03-25 ENCOUNTER — Ambulatory Visit: Payer: BC Managed Care – PPO | Admitting: Family Medicine

## 2020-06-03 ENCOUNTER — Ambulatory Visit (INDEPENDENT_AMBULATORY_CARE_PROVIDER_SITE_OTHER): Payer: Self-pay | Admitting: Obstetrics and Gynecology

## 2020-06-03 ENCOUNTER — Other Ambulatory Visit: Payer: Self-pay

## 2020-06-03 ENCOUNTER — Ambulatory Visit: Payer: Self-pay | Admitting: Family Medicine

## 2020-06-03 ENCOUNTER — Encounter: Payer: Self-pay | Admitting: Obstetrics and Gynecology

## 2020-06-03 VITALS — BP 144/91 | HR 95 | Ht 65.0 in | Wt 310.9 lb

## 2020-06-03 DIAGNOSIS — Z30432 Encounter for removal of intrauterine contraceptive device: Secondary | ICD-10-CM

## 2020-06-03 NOTE — Progress Notes (Signed)
   IUD Removal Procedure Note   Patient is 29 y.o. G1P0010 who is here for [place] IUD removal. She would like IUD removed secondary to getting married in September of 2021 and wanting to try to conceive. She has had no issues with IUD. She understands that she could get pregnant after removal of IUD if she does not use another form of contraception. She has no other complaints today. Reviewed risks of removal including pain, bleeding, difficult removal and inability to remove IUD which may require surgical removal in OR. She affirms that she would like IUD removed.  BP (!) 144/91 (BP Location: Left Arm)   Pulse 95   Ht 5\' 5"  (1.651 m)   Wt (!) 310 lb 14.4 oz (141 kg)   BMI 51.74 kg/m   Patient with normal appearing external female genitalia. Graves speculum placed in vagina and  IUD strings easily visualized. Strings grasped with kelly forceps and removed easily. Minimal bleeding noted. All instruments removed from vagina. Patient tolerated procedure very well.    She was given post removal instructions. She is planning on using no method for contraception.   Return 1 year for annual or prn.    , MSN, CNM 06/03/2020 2:47 PM

## 2020-06-04 ENCOUNTER — Encounter: Payer: Self-pay | Admitting: General Practice

## 2020-06-06 ENCOUNTER — Other Ambulatory Visit: Payer: Self-pay | Admitting: Family Medicine
